# Patient Record
Sex: Female | Born: 1965 | Race: White | Hispanic: No | Marital: Married | Smoking: Never smoker
Health system: Southern US, Community
[De-identification: ages and names within clinical notes are randomized; demographics above are authoritative.]

## PROBLEM LIST (undated history)

## (undated) DIAGNOSIS — F419 Anxiety disorder, unspecified: Secondary | ICD-10-CM

## (undated) DIAGNOSIS — K219 Gastro-esophageal reflux disease without esophagitis: Secondary | ICD-10-CM

## (undated) DIAGNOSIS — M549 Dorsalgia, unspecified: Secondary | ICD-10-CM

## (undated) DIAGNOSIS — T7840XA Allergy, unspecified, initial encounter: Secondary | ICD-10-CM

## (undated) HISTORY — DX: Gastro-esophageal reflux disease without esophagitis: K21.9

## (undated) HISTORY — DX: Anxiety disorder, unspecified: F41.9

## (undated) HISTORY — DX: Allergy, unspecified, initial encounter: T78.40XA

## (undated) HISTORY — PX: ABDOMINAL HYSTERECTOMY: SHX81

---

## 2006-02-04 ENCOUNTER — Encounter: Admission: RE | Admit: 2006-02-04 | Discharge: 2006-04-03 | Payer: Self-pay | Admitting: Family Medicine

## 2010-10-11 ENCOUNTER — Emergency Department (HOSPITAL_BASED_OUTPATIENT_CLINIC_OR_DEPARTMENT_OTHER)
Admission: EM | Admit: 2010-10-11 | Discharge: 2010-10-11 | Payer: Self-pay | Source: Home / Self Care | Admitting: Emergency Medicine

## 2010-10-12 ENCOUNTER — Ambulatory Visit: Payer: Self-pay | Admitting: Family Medicine

## 2010-11-25 ENCOUNTER — Encounter: Payer: Self-pay | Admitting: Internal Medicine

## 2010-12-12 ENCOUNTER — Encounter: Payer: Self-pay | Admitting: Family Medicine

## 2010-12-12 ENCOUNTER — Other Ambulatory Visit: Payer: Self-pay | Admitting: Family Medicine

## 2010-12-12 ENCOUNTER — Ambulatory Visit (INDEPENDENT_AMBULATORY_CARE_PROVIDER_SITE_OTHER): Payer: BC Managed Care – PPO | Admitting: Family Medicine

## 2010-12-12 DIAGNOSIS — F411 Generalized anxiety disorder: Secondary | ICD-10-CM

## 2010-12-12 DIAGNOSIS — R002 Palpitations: Secondary | ICD-10-CM | POA: Insufficient documentation

## 2010-12-13 LAB — CBC WITH DIFFERENTIAL/PLATELET
Basophils Relative: 0.3 % (ref 0.0–3.0)
Eosinophils Absolute: 0.1 10*3/uL (ref 0.0–0.7)
Hemoglobin: 13.6 g/dL (ref 12.0–15.0)
Lymphocytes Relative: 31.7 % (ref 12.0–46.0)
MCHC: 34.5 g/dL (ref 30.0–36.0)
Monocytes Relative: 5.8 % (ref 3.0–12.0)
Neutro Abs: 4.4 10*3/uL (ref 1.4–7.7)
RBC: 4.14 Mil/uL (ref 3.87–5.11)

## 2010-12-29 ENCOUNTER — Encounter: Payer: Self-pay | Admitting: Family Medicine

## 2011-01-01 NOTE — Assessment & Plan Note (Signed)
Summary: new pt --having panic attacks???,BCBS///sph   Vital Signs:  Patient profile:   45 year old female Height:      64 inches Weight:      152 pounds BMI:     26.19 Pulse rate:   110 / minute BP sitting:   130 / 90  (left arm)  Vitals Entered By: Doristine Devoid CMA (December 12, 2010 3:02 PM) CC: NEW EST- panic attacks heart racing xyest while out driving had to use onstar to calm down   History of Present Illness: 45 yo woman here today to establish care.  previous MD- [facility].  anxiety- drove daughter to school yesterday and when driving home had 'panic attack'.  heart was racing, 'beating out of my chest', got very thirsty, 'everything was closing up', tongue felt numb.  used [employer] to calm down.  was fine the rest of the day.  this AM again had 'wave of panic' when dropping off daughter.  sxs improve when talking to someone- this AM called husband.  has always been anxious.  having L sided neck spasm and tightness.  tearful during OV.  doesn't cry frequently but 'i worry all the time about everything'.  doesn't like to take meds.  will have rapid heart beat during sleep.  has never been in counseling.  has been given a script for xanax previously but never took any.  Preventive Screening-Counseling & Management  Alcohol-Tobacco     Alcohol drinks/day: <1     Smoking Status: quit     Year Quit: 1999  Caffeine-Diet-Exercise     Does Patient Exercise: no      Sexual History:  currently monogamous.        Drug Use:  never.    Current Medications (verified): 1)  Alprazolam 0.25 Mg Tabs (Alprazolam) .Marland Kitchen.. 1 Tab By Mouth As Needed For Severe Anxiety 2)  Citalopram Hydrobromide 10 Mg  Tabs (Citalopram Hydrobromide) .... Take 1 Tab By Mouth Daily 3)  Nasonex 50 Mcg/act Susp (Mometasone Furoate) .... 2 Sprays Each Nostril Once Daily  Allergies (verified): 1)  ! * Zithromycin  Past History:  Past Medical History: none  Past Surgical History: none  Family  History: CAD-no HTN-father,brother DM-mother STROKE-no COLON CA-no BREAST CA-no  Social History: married 2 daughtersSmoking Status:  quit Does Patient Exercise:  no Sexual History:  currently monogamous Drug Use:  never  Review of Systems      See HPI  Physical Exam  General:  Well-developed,well-nourished,in no acute distress; alert,appropriate and cooperative throughout examination Head:  Normocephalic and atraumatic without obvious abnormalities. No apparent alopecia or balding. Eyes:  PERRL, EOMI Neck:  No deformities, masses, or tenderness noted. Lungs:  Normal respiratory effort, chest expands symmetrically. Lungs are clear to auscultation, no crackles or wheezes. Heart:  Normal rate and regular rhythm. S1 and S2 normal without gallop, murmur, click, rub or other extra sounds. Psych:  very anxious, pressured speech, flight of ideas, tangential thoughts   Impression & Recommendations:  Problem # 1:  PALPITATIONS (ICD-785.1) Assessment New most likely due to pt's anxiety but need to r/o thyroid abnormalities, anemia.  EKG WNL. Orders: Venipuncture (95284) Specimen Handling (13244) EKG w/ Interpretation (93000) TLB-TSH (Thyroid Stimulating Hormone) (84443-TSH) TLB-CBC Platelet - w/Differential (85025-CBCD)  Problem # 2:  ANXIETY STATE, UNSPECIFIED (ICD-300.00) Assessment: New pt w/ obvious anxiety.  previous MD agreed and attempted to start benzo to control sxs- pt never used.  again recommend benzos for panic attacks and will start controller medication.  will follow closely. Her updated medication list for this problem includes:    Alprazolam 0.25 Mg Tabs (Alprazolam) .Marland Kitchen... 1 tab by mouth as needed for severe anxiety    Citalopram Hydrobromide 10 Mg Tabs (Citalopram hydrobromide) .Marland Kitchen... Take 1 tab by mouth daily  Complete Medication List: 1)  Alprazolam 0.25 Mg Tabs (Alprazolam) .Marland Kitchen.. 1 tab by mouth as needed for severe anxiety 2)  Citalopram Hydrobromide 10 Mg  Tabs (Citalopram hydrobromide) .... Take 1 tab by mouth daily 3)  Nasonex 50 Mcg/act Susp (Mometasone furoate) .... 2 sprays each nostril once daily  Patient Instructions: 1)  Please schedule a complete physical in the next 4-6 weeks 2)  Start the Citalopram daily as a controller medicine for your anxiety 3)  Use the Alprazolam as needed for severe panic attacks 4)  Consider starting counseling for your anxiety- it has gone from just something that is a part of you to something that is affecting your daily life 5)  We'll call you to set up your holter monitor 6)  Start the nasal spray to decrease your nasal congestion, post nasal drip and sinus pressure 7)  Call with any questions or concerns 8)  Hang in there! Prescriptions: NASONEX 50 MCG/ACT SUSP (MOMETASONE FUROATE) 2 sprays each nostril once daily  #1 x 3   Entered and Authorized by:   Neena Rhymes MD   Signed by:   Neena Rhymes MD on 12/12/2010   Method used:   Print then Give to Patient   RxID:   [id number] CITALOPRAM HYDROBROMIDE 10 MG  TABS (CITALOPRAM HYDROBROMIDE) Take 1 tab by mouth daily  #30 x 3   Entered and Authorized by:   Neena Rhymes MD   Signed by:   Neena Rhymes MD on 12/12/2010   Method used:   Print then Give to Patient   RxID:   [id number] ALPRAZOLAM 0.25 MG TABS (ALPRAZOLAM) 1 tab by mouth as needed for severe anxiety  #30 x 0   Entered and Authorized by:   Neena Rhymes MD   Signed by:   Neena Rhymes MD on 12/12/2010   Method used:   Print then Give to Patient   RxID:   [id number]    Orders Added: 1)  Venipuncture [40347] 2)  Specimen Handling [99000] 3)  EKG w/ Interpretation [93000] 4)  TLB-TSH (Thyroid Stimulating Hormone) [84443-TSH] 5)  TLB-CBC Platelet - w/Differential [85025-CBCD] 6)  New Patient Level II [42595]

## 2011-01-15 LAB — CBC
HCT: 41.1 % (ref 36.0–46.0)
MCH: 31.3 pg (ref 26.0–34.0)
MCHC: 35.3 g/dL (ref 30.0–36.0)
MCV: 88.6 fL (ref 78.0–100.0)
RDW: 12.1 % (ref 11.5–15.5)

## 2011-01-15 LAB — BASIC METABOLIC PANEL WITH GFR
BUN: 8 mg/dL (ref 6–23)
CO2: 24 meq/L (ref 19–32)
Calcium: 9.6 mg/dL (ref 8.4–10.5)
Chloride: 107 meq/L (ref 96–112)
Creatinine, Ser: 0.8 mg/dL (ref 0.4–1.2)
GFR calc non Af Amer: >60 mL/min
Glucose, Bld: 119 mg/dL — ABNORMAL HIGH (ref 70–99)
Potassium: 4.1 meq/L (ref 3.5–5.1)
Sodium: 147 meq/L — ABNORMAL HIGH (ref 135–145)

## 2011-01-15 LAB — DIFFERENTIAL
Basophils Absolute: 0 10*3/uL (ref 0.0–0.1)
Basophils Relative: 1 % (ref 0–1)
Eosinophils Relative: 0 % (ref 0–5)
Monocytes Absolute: 0.4 10*3/uL (ref 0.1–1.0)

## 2011-01-21 ENCOUNTER — Encounter: Payer: Self-pay | Admitting: Family Medicine

## 2011-01-21 ENCOUNTER — Encounter (INDEPENDENT_AMBULATORY_CARE_PROVIDER_SITE_OTHER): Payer: BC Managed Care – PPO | Admitting: Family Medicine

## 2011-01-21 ENCOUNTER — Other Ambulatory Visit: Payer: Self-pay | Admitting: Family Medicine

## 2011-01-21 DIAGNOSIS — Z0000 Encounter for general adult medical examination without abnormal findings: Secondary | ICD-10-CM

## 2011-01-21 DIAGNOSIS — F411 Generalized anxiety disorder: Secondary | ICD-10-CM

## 2011-01-21 LAB — BASIC METABOLIC PANEL
CO2: 24 mEq/L (ref 19–32)
GFR: 71.88 mL/min (ref >60.00)
Glucose, Bld: 132 mg/dL — ABNORMAL HIGH (ref 70–99)
Potassium: 4.1 mEq/L (ref 3.5–5.1)
Sodium: 136 mEq/L (ref 135–145)

## 2011-01-21 LAB — LIPID PANEL: VLDL: 20 mg/dL (ref 0.0–40.0)

## 2011-01-21 LAB — HEPATIC FUNCTION PANEL
AST: 17 U/L (ref 0–37)
Albumin: 4.4 g/dL (ref 3.5–5.2)
Alkaline Phosphatase: 48 U/L (ref 39–117)

## 2011-01-22 ENCOUNTER — Telehealth: Payer: Self-pay | Admitting: *Deleted

## 2011-01-22 MED ORDER — VITAMIN D (ERGOCALCIFEROL) 1.25 MG (50000 UNIT) PO CAPS: ORAL_CAPSULE | ORAL | Status: DC

## 2011-01-22 NOTE — Telephone Encounter (Signed)
Pt notified and notes that she was not fasting at the time of labs, she had eaten a Nutri-Grain bar and diet soda. She is aware we will not add on labs at this time. Pt would like vitamin d rx sent to [facility] on [address].  Copied from [facility]: please make sure pt was actually fasting.  if she was- we need to add A1C b/c sugar was elevated.  LDL was also mildly elevated but this will improve w/ diet and exercise  rest of labs look good!   Signed by Neena Rhymes MD on 01/21/2011 at 4:28 PM Vit D level low- needs to start 50,000 units Vit D weekly x8 weeks and then recheck level   Signed by Neena Rhymes MD on 01/22/2011 at 8:16 AM

## 2011-01-31 NOTE — Assessment & Plan Note (Signed)
Summary: cpx/cbs   Vital Signs:  Patient profile:   45 year old female Height:      64 inches (162.56 cm) Weight:      147.50 pounds (67.05 kg) BMI:     25.41 Temp:     98.5 degrees F (36.94 degrees C) oral BP sitting:   112 / 78  (left arm) Cuff size:   regular  Vitals Entered By: Lucious Groves CMA (January 21, 2011 10:06 AM) CC: CPX no pap./kb Is Patient Diabetic? No Pain Assessment Patient in pain? no      Comments Patient notes that she is on her cycle so we cannot do pap, but she does need one. Patient wonders if she needs appt with GYN for cycle issues.   History of Present Illness: 45 yo woman here today for CPE.  anxiety- started counseling.  did not start meds.  reports the anxiety has somewhat improved.  not interested in starting medication.  denies recent palpitations.  not interested in holter monitor.  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  Preventive Screening-Counseling & Management  Alcohol-Tobacco     Alcohol drinks/day: <1     Smoking Status: quit     Year Quit: 1998  Caffeine-Diet-Exercise     Does Patient Exercise: no      Sexual History:  currently monogamous.        Drug Use:  never.    Current Medications (verified): 1)  Nasonex 50 Mcg/act Susp (Mometasone Furoate) .... 2 Sprays Each Nostril Once Daily  Allergies (verified): 1)  ! * Zithromycin  Past History:  Past medical, surgical, family and social histories (including risk factors) reviewed, and no changes noted (except as noted below).  Past Medical History: anxiety  Past Surgical History: Reviewed history from 12/12/2010 and no changes required. none  Family History: Reviewed history from 12/12/2010 and no changes required. CAD-no HTN-father,brother DM-mother STROKE-no COLON CA-no BREAST CA-no  Social History: Reviewed history from 12/12/2010 and no changes required. married 2 daughters  Review of Systems       The patient complains of depression.   The patient denies anorexia, fever, weight loss, weight gain, vision loss, decreased hearing, hoarseness, chest pain, syncope, dyspnea on exertion, peripheral edema, prolonged cough, headaches, abdominal pain, melena, hematochezia, severe indigestion/heartburn, hematuria, suspicious skin lesions, abnormal bleeding, enlarged lymph nodes, and breast masses.    Physical Exam  General:  Well-developed,well-nourished,in no acute distress; alert,appropriate and cooperative throughout examination Head:  Normocephalic and atraumatic without obvious abnormalities. No apparent alopecia or balding. Eyes:  No corneal or conjunctival inflammation noted. EOMI. Perrla. Funduscopic exam benign, without hemorrhages, exudates or papilledema. Vision grossly normal. Ears:  External ear exam shows no significant lesions or deformities.  Otoscopic examination reveals clear canals, tympanic membranes are intact bilaterally without bulging, retraction, inflammation or discharge. Hearing is grossly normal bilaterally. Nose:  External nasal examination shows no deformity or inflammation. Nasal mucosa are pink and moist without lesions or exudates. Mouth:  Oral mucosa and oropharynx without lesions or exudates.  Teeth in good repair. Neck:  No deformities, masses, or tenderness noted. Lungs:  Normal respiratory effort, chest expands symmetrically. Lungs are clear to auscultation, no crackles or wheezes. Heart:  Normal rate and regular rhythm. S1 and S2 normal without gallop, murmur, click, rub or other extra sounds. Abdomen:  Bowel sounds positive,abdomen soft and non-tender without masses, organomegaly or hernias noted. Pulses:  +2 carotid, radial, DP Extremities:  No clubbing, cyanosis, edema, or deformity  noted with normal full range of motion of all joints.   Neurologic:  No cranial nerve deficits noted. Station and gait are normal. Plantar reflexes are down-going bilaterally. DTRs are symmetrical throughout. Sensory,  motor and coordinative functions appear intact. Skin:  Intact without suspicious lesions or rashes Cervical Nodes:  No lymphadenopathy noted Psych:  less anxious today but still noteably anxious.  ideas more linear today- more focused   Impression & Recommendations:  Problem # 1:  PHYSICAL EXAMINATION (ICD-V70.0) Assessment New pt's PE WNL.  check labs.  refer to GYN at pt's request. Orders: Venipuncture (11914) TLB-Lipid Panel (80061-LIPID) TLB-BMP (Basic Metabolic Panel-BMET) (80048-METABOL) TLB-Hepatic/Liver Function Pnl (80076-HEPATIC) T-Vitamin D (25-Hydroxy) (78295-62130) Gynecologic Referral (Gyn) Specimen Handling (86578)  Problem # 2:  ANXIETY STATE, UNSPECIFIED (ICD-300.00) Assessment: Unchanged pt is not taking meds as directed.  did start counseling.  applauded this decision.  will follow. The following medications were removed from the medication list:    Alprazolam 0.25 Mg Tabs (Alprazolam) .Marland Kitchen... 1 tab by mouth as needed for severe anxiety    Citalopram Hydrobromide 10 Mg Tabs (Citalopram hydrobromide) .Marland Kitchen... Take 1 tab by mouth daily  Complete Medication List: 1)  Nasonex 50 Mcg/act Susp (Mometasone furoate) .... 2 sprays each nostril once daily  Patient Instructions: 1)  Follow up in 3 months to recheck your anxiety 2)  We'll notify you of your lab results 3)  Someone will call you with your GYN appt 4)  Call with any questions or concerns 5)  Hang in there!!!   Orders Added: 1)  Venipuncture [36415] 2)  TLB-Lipid Panel [80061-LIPID] 3)  TLB-BMP (Basic Metabolic Panel-BMET) [80048-METABOL] 4)  TLB-Hepatic/Liver Function Pnl [80076-HEPATIC] 5)  T-Vitamin D (25-Hydroxy) [46962-95284] 6)  Gynecologic Referral [Gyn] 7)  Specimen Handling [99000] 8)  Est. Patient 40-64 years [99396] 9)  Est. Patient Level III [13244]

## 2011-04-24 ENCOUNTER — Ambulatory Visit: Payer: BC Managed Care – PPO | Admitting: Family Medicine

## 2011-05-16 ENCOUNTER — Emergency Department (HOSPITAL_BASED_OUTPATIENT_CLINIC_OR_DEPARTMENT_OTHER)
Admission: EM | Admit: 2011-05-16 | Discharge: 2011-05-16 | Disposition: A | Discharge status: Home / Self Care | Payer: BC Managed Care – PPO | Attending: Emergency Medicine | Admitting: Emergency Medicine

## 2011-05-16 ENCOUNTER — Encounter (HOSPITAL_BASED_OUTPATIENT_CLINIC_OR_DEPARTMENT_OTHER): Payer: Self-pay | Admitting: *Deleted

## 2011-05-16 DIAGNOSIS — X500XXA Overexertion from strenuous movement or load, initial encounter: Secondary | ICD-10-CM | POA: Insufficient documentation

## 2011-05-16 DIAGNOSIS — S139XXA Sprain of joints and ligaments of unspecified parts of neck, initial encounter: Secondary | ICD-10-CM | POA: Insufficient documentation

## 2011-05-16 DIAGNOSIS — S161XXA Strain of muscle, fascia and tendon at neck level, initial encounter: Secondary | ICD-10-CM

## 2011-05-16 DIAGNOSIS — Y9289 Other specified places as the place of occurrence of the external cause: Secondary | ICD-10-CM | POA: Insufficient documentation

## 2011-05-16 HISTORY — DX: Dorsalgia, unspecified: M54.9

## 2011-05-16 MED ORDER — DIAZEPAM 5 MG PO TABS: 2.5000 mg | ORAL_TABLET | Freq: Once | ORAL | Status: DC

## 2011-05-16 MED ORDER — KETOROLAC TROMETHAMINE 30 MG/ML IJ SOLN
30.0000 mg | Freq: Once | INTRAMUSCULAR | Status: DC
  Filled 2011-05-16: qty 1

## 2011-05-16 MED ORDER — ONDANSETRON 8 MG PO TBDP
8.0000 mg | ORAL_TABLET | Freq: Once | ORAL | Status: DC
  Filled 2011-05-16: qty 1

## 2011-05-16 MED ORDER — DIAZEPAM 2 MG PO TABS
2.0000 mg | ORAL_TABLET | Freq: Once | ORAL | Status: DC
  Filled 2011-05-16: qty 1

## 2011-05-16 MED ORDER — DIAZEPAM 5 MG PO TABS: 5.0000 mg | ORAL_TABLET | Freq: Four times a day (QID) | ORAL | Status: AC | PRN

## 2011-05-16 MED ORDER — DIAZEPAM 5 MG PO TABS
ORAL_TABLET | ORAL | Status: AC
  Filled 2011-05-16: qty 1

## 2011-05-16 NOTE — ED Provider Notes (Addendum)
History     Chief Complaint  Patient presents with  . Neck Pain   HPI Pt w/ h/o chronic neck pain presents w/ acutely severe pain in left side of neck x 4 days.  Pain started while sitting at a show w/ head rotated to the right x 3 hours.   Aggravated by minimal head rotation and ROM of either upper extremity.  Denies dyspnea/dysphagia.  Denies cough, CP, SOB.  Husband states that pain has brought pt to tears.  Past Medical History  Diagnosis Date  . Anxiety   . Anxiety   . Back pain     History reviewed. No pertinent past surgical history.  Family History  Problem Relation Age of Onset  . Diabetes Mother   . Hypertension Father   . Hypertension Brother     History  Substance Use Topics  . Smoking status: Never Smoker   . Smokeless tobacco: Not on file  . Alcohol Use: No    OB History    Grav Para Term Preterm Abortions TAB SAB Ect Mult Living                  Review of Systems  All other systems reviewed and are negative.    Physical Exam  BP 131/72  Pulse 82  Temp(Src) 98.1 F (36.7 C) (Oral)  Resp 20  Ht 5\' 4"  (1.626 m)  Wt 140 lb (63.504 kg)  BMI 24.03 kg/m2  SpO2 100%  LMP 04/29/2011  Physical Exam  Constitutional: She appears well-developed and well-nourished. She appears distressed.  HENT:  Head: Normocephalic and atraumatic.  Neck: Neck supple. No spinous process tenderness present. Carotid bruit is not present. Decreased range of motion present. No edema present.       Pt points to pain at left SCM and trapezius.  No overlying skin changes. Non-tender.    Cardiovascular: Normal rate and regular rhythm.   Pulmonary/Chest: Effort normal and breath sounds normal. No stridor. No respiratory distress.       Non-tender  Lymphadenopathy:    She has no cervical adenopathy.    ED Course  Procedures  MDM Pt presents w/ acute on chronic left neck pain.  Pain reproducible w/ head rotation and ROM of LUE on exam.  S/Sx most consistent w/ cervical  strain but can not r/o radiculopathy.  Offered pt 2.5mg  valium and IM toradol in ED for pain control but she declined.  Reports being very sensitive to medications.  Recommended conservative therapy including NSAID, rest, heating pad or ice and then f/u with NS if sx do not improve.  She received a soft cervical collar for comfort.  Advised her to return if she develops fever or symptoms worsen.        Otilio Miu, PA 05/17/11 0033  Otilio Miu, PA 05/17/11 0033  Otilio Miu, PA 05/17/11 [phone]  Evaluation and management procedures were performed by the PA/NP under my supervision/collaboration.    Felisa Bonier, MD 08/31/11 [phone]

## 2011-05-16 NOTE — ED Notes (Signed)
Went in to administer patient medication, pt states that she does not want the medication that she just wants prescriptions and be able to go home. Discussed with PA

## 2011-07-17 IMAGING — CT CT HEAD W/O CM
1 series · 16 of 30 positions shown, 20 images · non-contrast
Comparison: None

CLINICAL DATA: Dizziness.  Left facial pressure

CT HEAD WITHOUT CONTRAST
TECHNIQUE: Contiguous axial images were obtained from the base of
the skull through the vertex without contrast.

[Series 2: head 4.8 h37s · axial · 0.42mm/px · z∈[-208,-56]mm · 16 of 36 slices shown, 20 images]
[im 2/36  brain]
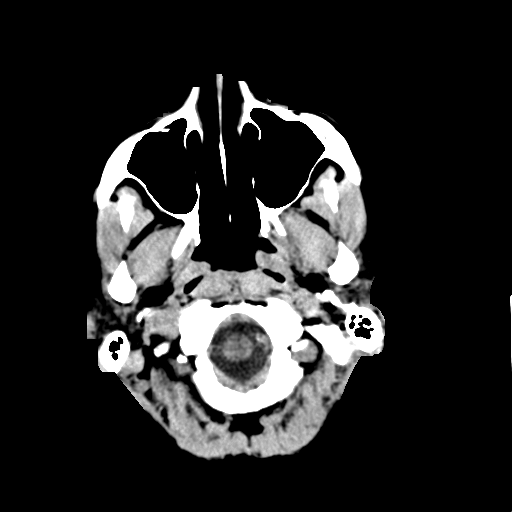
[im 2/36  bone]
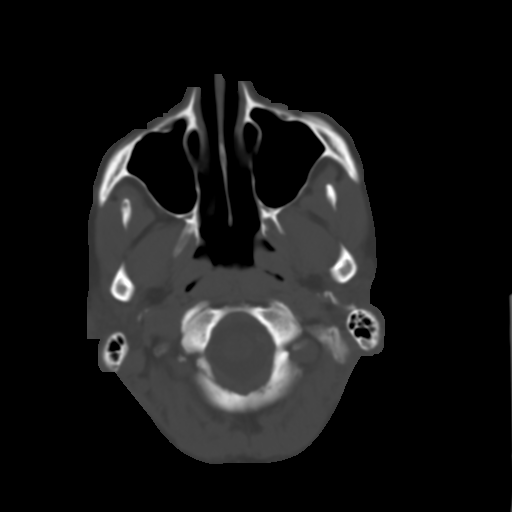
[im 4/36  brain]
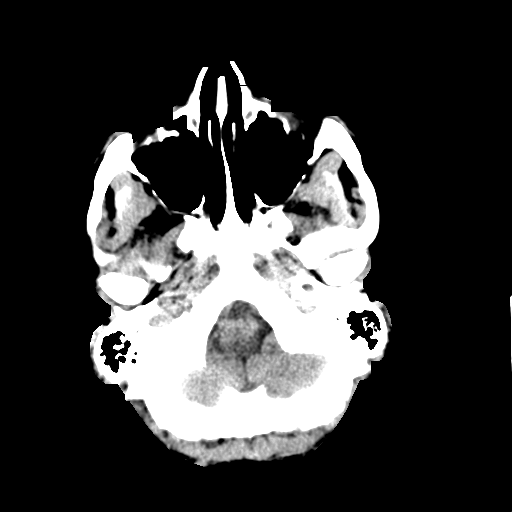
[im 7/36  brain]
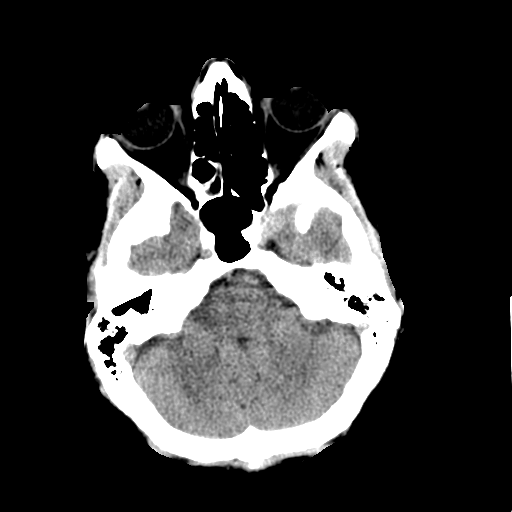
[im 9/36  brain]
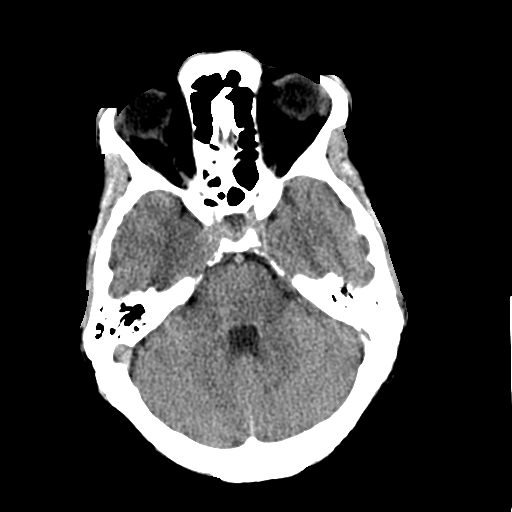
[im 10/36  brain]
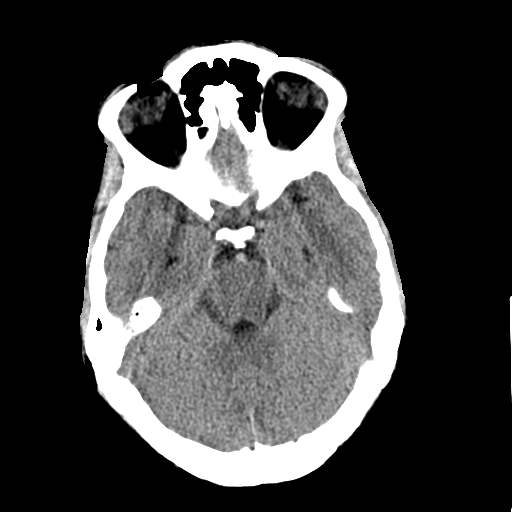
[im 10/36  bone]
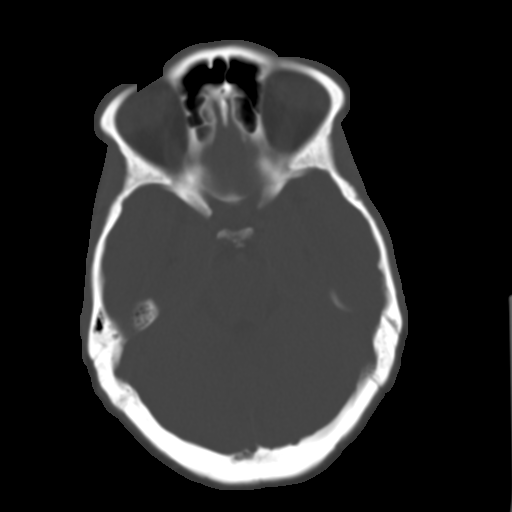
[im 13/36  brain]
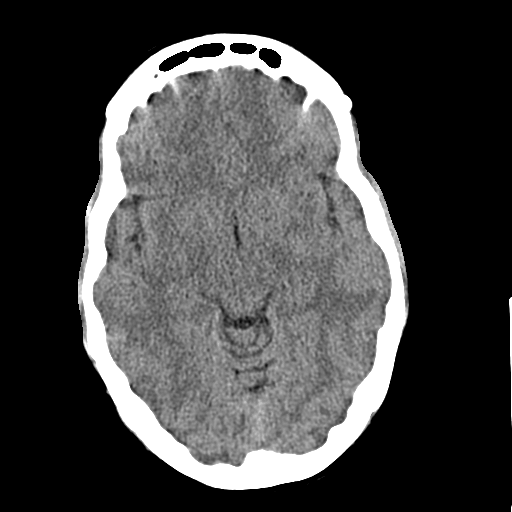
[im 15/36  brain]
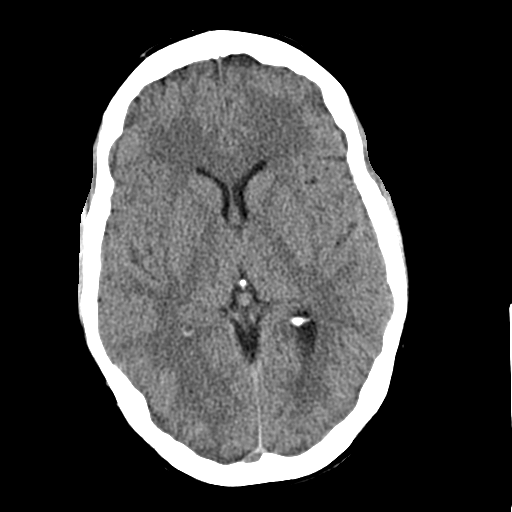
[im 17/36  brain]
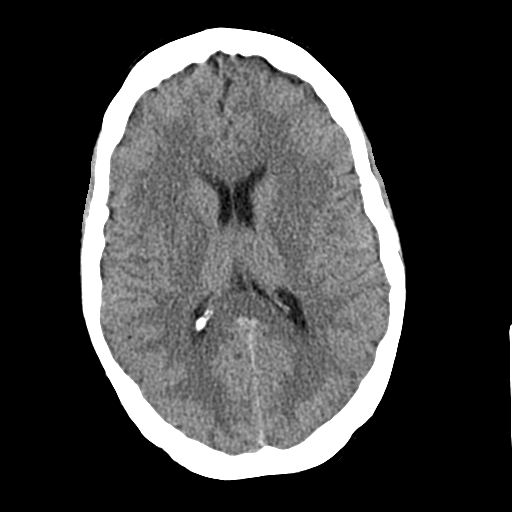
[im 19/36  brain]
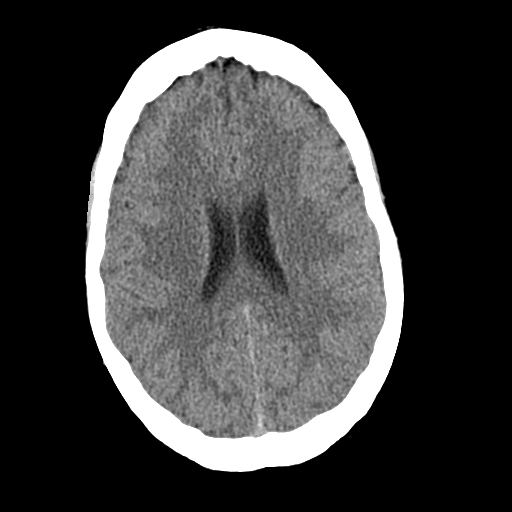
[im 19/36  bone]
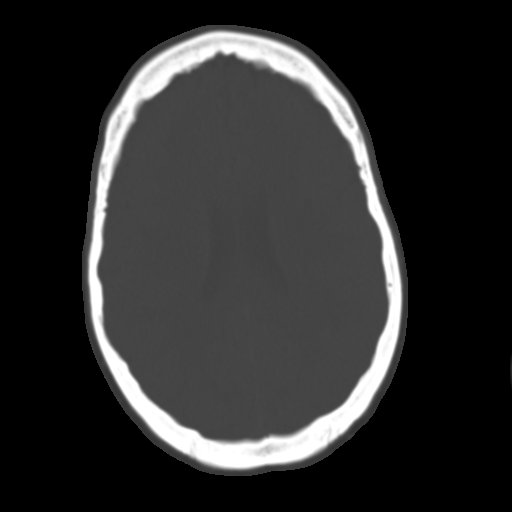
[im 21/36  brain]
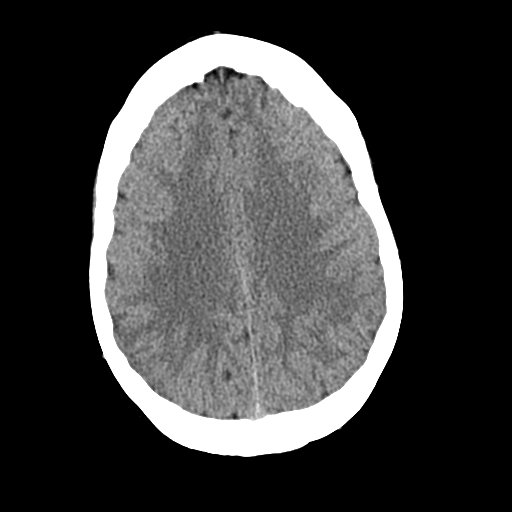
[im 23/36  brain]
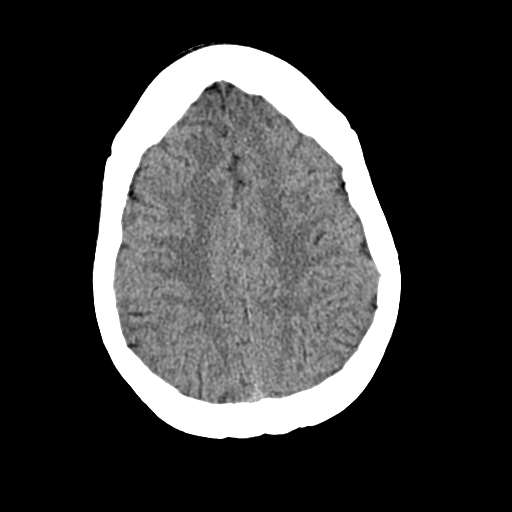
[im 26/36  brain]
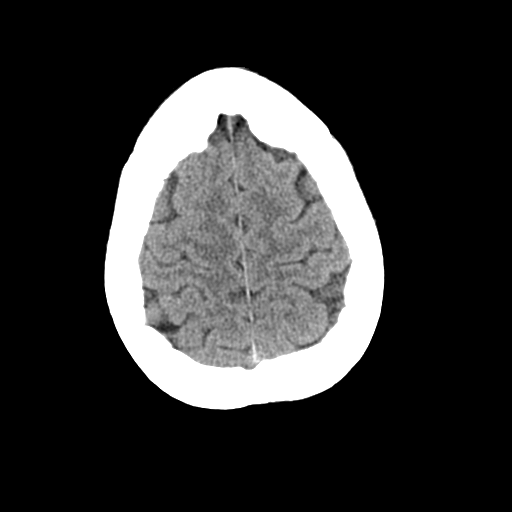
[im 27/36  brain]
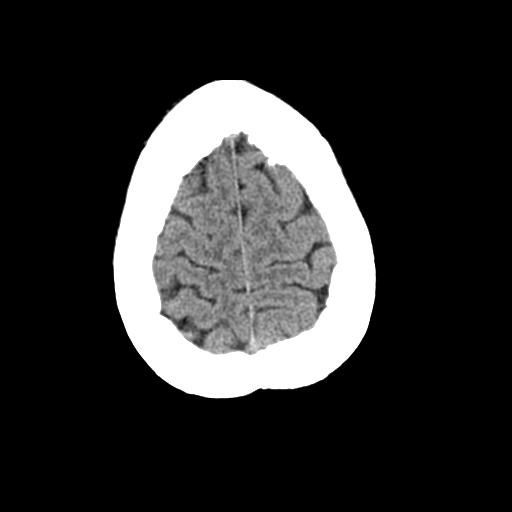
[im 27/36  bone]
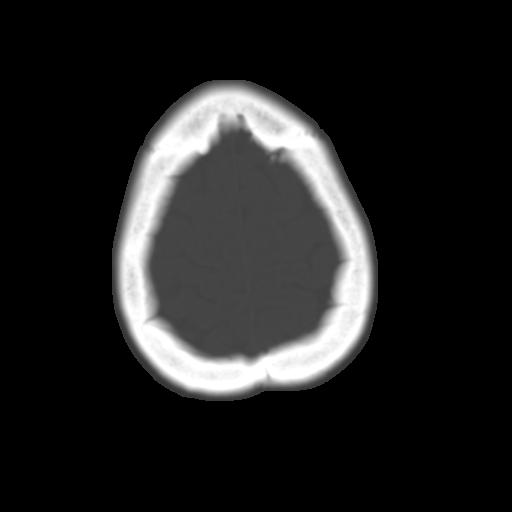
[im 29/36  brain]
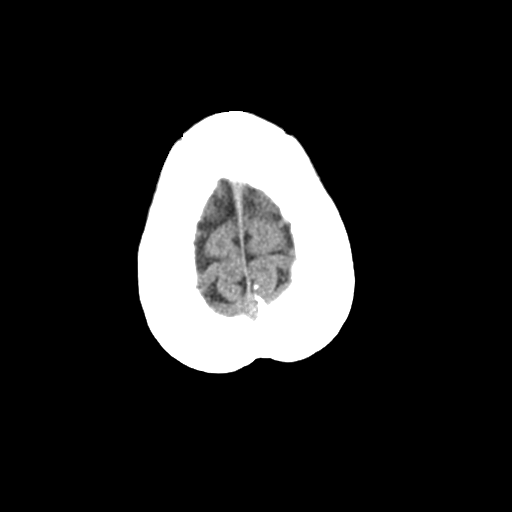
[im 32/36  brain]
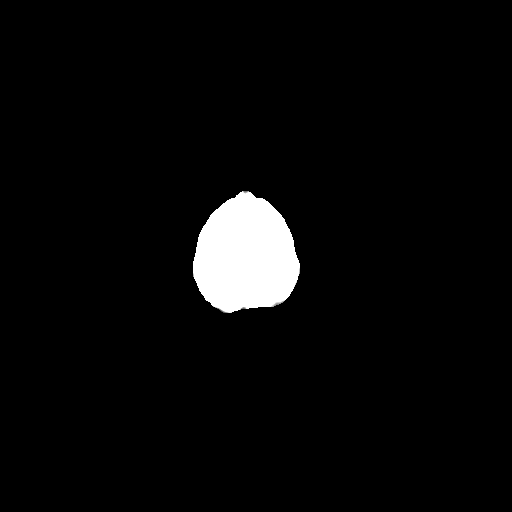
[im 34/36  brain]
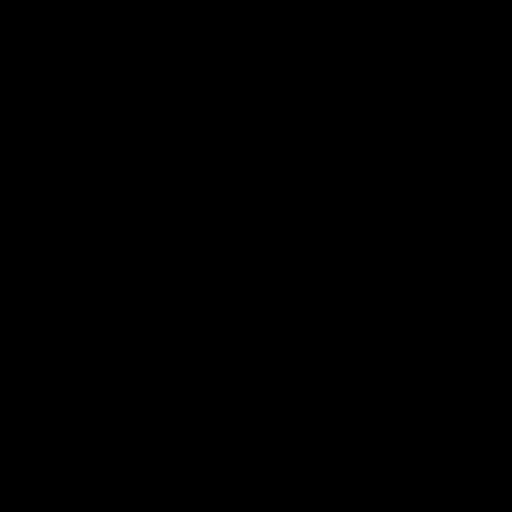

[16 of 30 positions shown; findings below may reference images not displayed]

FINDINGS: Ventricle size is normal.  Negative for infarct or mass.
Negative for hemorrhage.

Small osteoma lateral to the left orbit.  No acute bony
abnormality.  Mild chronic sinusitis.
IMPRESSION: Normal brain

Mild chronic sinusitis.

## 2011-08-02 ENCOUNTER — Other Ambulatory Visit: Payer: Self-pay | Admitting: Obstetrics and Gynecology

## 2011-08-02 DIAGNOSIS — R928 Other abnormal and inconclusive findings on diagnostic imaging of breast: Secondary | ICD-10-CM

## 2011-08-14 ENCOUNTER — Ambulatory Visit
Admission: RE | Admit: 2011-08-14 | Discharge: 2011-08-14 | Disposition: A | Discharge status: Home / Self Care | Payer: BC Managed Care – PPO | Source: Ambulatory Visit | Attending: Obstetrics and Gynecology | Admitting: Obstetrics and Gynecology

## 2011-08-14 DIAGNOSIS — R928 Other abnormal and inconclusive findings on diagnostic imaging of breast: Secondary | ICD-10-CM

## 2012-03-26 ENCOUNTER — Telehealth: Payer: Self-pay | Admitting: Family Medicine

## 2012-03-26 NOTE — Telephone Encounter (Signed)
Pt would like to know if Dr. Beverely Low can treat her for her Rosacea. She states she used to see Dr. Oliver Hum and he gave her a cream for it that helped, but it is expired and would like a new rx for it. She does not want to come to see Dr. Beverely Low if she is going to be referred to a dermatologist due to her high copays. Please advise.

## 2012-03-26 NOTE — Telephone Encounter (Signed)
Called pt to advise MD Beverely Low is willing to treat her rosacea, however notes pt is overdue for CPE, per verbal from MD Tabori advised to set pt up for an acute visit and we can get her CPE scheduled after the acute visit,pt understood and accepted apt on 04-01-12 at 11:15am,

## 2012-04-01 ENCOUNTER — Ambulatory Visit (INDEPENDENT_AMBULATORY_CARE_PROVIDER_SITE_OTHER): Payer: BC Managed Care – PPO | Admitting: Family Medicine

## 2012-04-01 ENCOUNTER — Encounter: Payer: Self-pay | Admitting: Family Medicine

## 2012-04-01 VITALS — BP 124/79 | HR 78 | Temp 98.6°F | Ht 64.5 in | Wt 159.0 lb

## 2012-04-01 DIAGNOSIS — L719 Rosacea, unspecified: Secondary | ICD-10-CM | POA: Insufficient documentation

## 2012-04-01 DIAGNOSIS — F411 Generalized anxiety disorder: Secondary | ICD-10-CM

## 2012-04-01 MED ORDER — METRONIDAZOLE 0.75 % EX LOTN: 1.0000 "application " | TOPICAL_LOTION | Freq: Two times a day (BID) | CUTANEOUS | Status: DC

## 2012-04-01 MED ORDER — SERTRALINE HCL 25 MG PO TABS: 25.0000 mg | ORAL_TABLET | Freq: Every day | ORAL | Status: DC

## 2012-04-01 NOTE — Assessment & Plan Note (Signed)
Deteriorated.  Pt now fearful of leaving house.  Stressed importance of SSRI to better control sxs despite pt's fear of starting med.  Discussed counseling in addition to meds.  Will follow closely.

## 2012-04-01 NOTE — Progress Notes (Signed)
  Subjective:    Patient ID: Karen Cisneros, female    DOB: [birth year], 46 y.o.   MRN: [id number]  HPI Rosacea- went to derm in 2008 and was treated w/ Metronidazole lotion 0.75%.  Had been sx free for quite a while but recently again developed redness and acne like bumps on her cheeks.  Some increased stress recently.   Anxiety- chronic problem, never filled meds.  Never took xanax.  Very nervous anytime she needs to leave the house.  Was previously in counseling but stopped.  'it's just too much.  i've been dealing w/ it for so long'.  Review of Systems For ROS see HPI     Objective:   Physical Exam  Vitals reviewed. Constitutional: She appears well-developed and well-nourished. She appears distressed (tearful, obviously upset).  HENT:  Head: Normocephalic and atraumatic.  Skin: Skin is warm and dry. There is erythema (facial erythema w/ acne like lesions on cheeks- consistent w/ rosacea).  Psychiatric:       Tearful, anxious, obviously distraught          Assessment & Plan:

## 2012-04-01 NOTE — Assessment & Plan Note (Signed)
New.  Start topical metronidazole.

## 2012-04-01 NOTE — Patient Instructions (Signed)
Follow up in 1 month to see how the meds are working Use the Metronidazole lotion as needed on the face Call with any questions or concerns Hang in there!!!

## 2012-05-01 ENCOUNTER — Ambulatory Visit: Payer: BC Managed Care – PPO | Admitting: Family Medicine

## 2012-05-25 ENCOUNTER — Encounter: Payer: BC Managed Care – PPO | Admitting: Family Medicine

## 2012-07-21 ENCOUNTER — Encounter: Payer: BC Managed Care – PPO | Admitting: Family Medicine

## 2015-12-22 ENCOUNTER — Emergency Department (HOSPITAL_BASED_OUTPATIENT_CLINIC_OR_DEPARTMENT_OTHER)
Admission: EM | Admit: 2015-12-22 | Discharge: 2015-12-22 | Disposition: A | Discharge status: Home / Self Care | Payer: 59 | Attending: Emergency Medicine | Admitting: Emergency Medicine

## 2015-12-22 ENCOUNTER — Encounter (HOSPITAL_BASED_OUTPATIENT_CLINIC_OR_DEPARTMENT_OTHER): Payer: Self-pay | Admitting: *Deleted

## 2015-12-22 DIAGNOSIS — R1030 Lower abdominal pain, unspecified: Secondary | ICD-10-CM | POA: Insufficient documentation

## 2015-12-22 DIAGNOSIS — R39198 Other difficulties with micturition: Secondary | ICD-10-CM | POA: Insufficient documentation

## 2015-12-22 DIAGNOSIS — Z3202 Encounter for pregnancy test, result negative: Secondary | ICD-10-CM | POA: Diagnosis not present

## 2015-12-22 DIAGNOSIS — R3915 Urgency of urination: Secondary | ICD-10-CM | POA: Insufficient documentation

## 2015-12-22 DIAGNOSIS — Z8659 Personal history of other mental and behavioral disorders: Secondary | ICD-10-CM | POA: Insufficient documentation

## 2015-12-22 DIAGNOSIS — R140 Abdominal distension (gaseous): Secondary | ICD-10-CM | POA: Diagnosis not present

## 2015-12-22 DIAGNOSIS — R339 Retention of urine, unspecified: Secondary | ICD-10-CM | POA: Diagnosis not present

## 2015-12-22 LAB — URINALYSIS, ROUTINE W REFLEX MICROSCOPIC
Bilirubin Urine: NEGATIVE
GLUCOSE, UA: NEGATIVE mg/dL
HGB URINE DIPSTICK: NEGATIVE
KETONES UR: NEGATIVE mg/dL
LEUKOCYTES UA: NEGATIVE
Nitrite: NEGATIVE
PH: 5.5 (ref 5.0–8.0)
Protein, ur: NEGATIVE mg/dL
Specific Gravity, Urine: 1.012 (ref 1.005–1.030)

## 2015-12-22 LAB — BASIC METABOLIC PANEL
Anion gap: 9 (ref 5–15)
BUN: 13 mg/dL (ref 6–20)
CHLORIDE: 104 mmol/L (ref 101–111)
CO2: 24 mmol/L (ref 22–32)
CREATININE: 0.84 mg/dL (ref 0.44–1.00)
Calcium: 9.3 mg/dL (ref 8.9–10.3)
GFR calc non Af Amer: >60 mL/min (ref >60)
GLUCOSE: 136 mg/dL — AB (ref 65–99)
Potassium: 4.1 mmol/L (ref 3.5–5.1)
Sodium: 137 mmol/L (ref 135–145)

## 2015-12-22 LAB — CBC WITH DIFFERENTIAL/PLATELET
BASOS ABS: 0.1 10*3/uL (ref 0.0–0.1)
Basophils Relative: 1 %
EOS PCT: 1 %
Eosinophils Absolute: 0.1 10*3/uL (ref 0.0–0.7)
HEMATOCRIT: 38.8 % (ref 36.0–46.0)
Hemoglobin: 12.5 g/dL (ref 12.0–15.0)
LYMPHS ABS: 1.8 10*3/uL (ref 0.7–4.0)
LYMPHS PCT: 18 %
MCH: 28.2 pg (ref 26.0–34.0)
MCHC: 32.2 g/dL (ref 30.0–36.0)
MCV: 87.4 fL (ref 78.0–100.0)
Monocytes Absolute: 0.4 10*3/uL (ref 0.1–1.0)
Monocytes Relative: 4 %
NEUTROS ABS: 7.7 10*3/uL (ref 1.7–7.7)
Neutrophils Relative %: 76 %
Platelets: 266 10*3/uL (ref 150–400)
RBC: 4.44 MIL/uL (ref 3.87–5.11)
RDW: 13.4 % (ref 11.5–15.5)
WBC: 10 10*3/uL (ref 4.0–10.5)

## 2015-12-22 LAB — PREGNANCY, URINE: Preg Test, Ur: NEGATIVE

## 2015-12-22 NOTE — ED Provider Notes (Signed)
CSN: [id number]     Arrival date & time 12/22/15  4098 History   First MD Initiated Contact with Patient 12/22/15 [phone]     Chief Complaint  Patient presents with  . Urinary Retention    Karen Cisneros is a 50 y.o. female who presents to the ED complaining of not being able to urinate since waking up this morning. She last urinated last night. She reports suprapubic pressure. She reports she's been having some urinary urgency over the past 5 days. She denies any abdominal pain or flank pain. She denies any dysuria. No hematuria. Patient reports an urge to urinate but has been unable to urinate today. Patient is currently on her menstrual cycle. She denies fevers, vaginal bleeding, vaginal discharge, hematuria, dysuria, rashes, abdominal pain, nausea, vomiting or diarrhea.  The history is provided by the patient. No language interpreter was used.    Past Medical History  Diagnosis Date  . Anxiety   . Anxiety   . Back pain    History reviewed. No pertinent past surgical history. Family History  Problem Relation Age of Onset  . Diabetes Mother   . Hypertension Father   . Hypertension Brother    Social History  Substance Use Topics  . Smoking status: Never Smoker   . Smokeless tobacco: None  . Alcohol Use: No   OB History    No data available     Review of Systems  Constitutional: Negative for fever and chills.  HENT: Negative for sore throat.   Eyes: Negative for visual disturbance.  Respiratory: Negative for cough and shortness of breath.   Cardiovascular: Negative for chest pain.  Gastrointestinal: Negative for nausea, vomiting, abdominal pain, diarrhea and blood in stool.  Genitourinary: Positive for urgency and difficulty urinating. Negative for dysuria, frequency, hematuria, flank pain, vaginal bleeding, vaginal discharge and menstrual problem.  Musculoskeletal: Negative for back pain and neck pain.  Skin: Negative for rash.  Neurological: Negative for syncope and  headaches.      Allergies  Azithromycin  Home Medications   Prior to Admission medications   Not on File   BP 124/76 mmHg  Pulse 90  Temp(Src) 98.1 F (36.7 C) (Oral)  Resp 18  Ht  (1.626 m)  Wt 68.04 kg  BMI 25.73 kg/m2  SpO2 98%  LMP 12/22/2015 Physical Exam  Constitutional: She appears well-developed and well-nourished. No distress.  HENT:  Head: Normocephalic and atraumatic.  Mouth/Throat: Oropharynx is clear and moist.  Eyes: Conjunctivae are normal. Pupils are equal, round, and reactive to light. Right eye exhibits no discharge. Left eye exhibits no discharge.  Neck: Neck supple.  Cardiovascular: Normal rate, regular rhythm, normal heart sounds and intact distal pulses.  Exam reveals no gallop and no friction rub.   No murmur heard. Pulmonary/Chest: Effort normal and breath sounds normal. No respiratory distress. She has no wheezes. She has no rales.  Abdominal: Soft. Bowel sounds are normal. She exhibits distension. There is tenderness. There is no rebound and no guarding.  Abdomen soft. Bowel sounds are present. Patient has suprapubic abdominal tenderness to palpation of suprapubic distention. Bladder is palpable. No CVA or flank tenderness.  Musculoskeletal: She exhibits no edema.  Lymphadenopathy:    She has no cervical adenopathy.  Neurological: She is alert. Coordination normal.  Skin: Skin is warm and dry. No rash noted. She is not diaphoretic. No erythema. No pallor.  Psychiatric: She has a normal mood and affect. Her behavior is normal.  Nursing note and vitals  reviewed.   ED Course  Procedures (including critical care time) Labs Review Labs Reviewed  BASIC METABOLIC PANEL - Abnormal; Notable for the following:    Glucose, Bld 136 (*)    All other components within normal limits  URINALYSIS, ROUTINE W REFLEX MICROSCOPIC (NOT AT [facility])  PREGNANCY, URINE  CBC WITH DIFFERENTIAL/PLATELET    Imaging Review No results found. I have personally  reviewed and evaluated these lab results as part of my medical decision-making.   EKG Interpretation None      Filed Vitals:   12/22/15 0931 12/22/15 1147  BP: 154/86 124/76  Pulse: 106 90  Temp: 98.1 F (36.7 C)   TempSrc: Oral   Resp: 18 18  Height:  (1.626 m)   Weight: 68.04 kg   SpO2: 100% 98%     MDM   Final diagnoses:  Urinary retention   This  is a 50 y.o. female who presents to the ED complaining of not being able to urinate since waking up this morning. She last urinated last night. She reports suprapubic pressure. She reports she's been having some urinary urgency over the past 5 days. She denies any abdominal pain or flank pain. She denies any dysuria. No hematuria. Patient reports an urge to urinate but has been unable to urinate today. She reports that over the past several months she has had similar symptoms that were related to being on her menstrual cycle. On exam the patient is afebrile nontoxic appearing. Her abdomen is soft and she has tenderness suprapubically. Her bladder is palpable. Bladder scan reveals 688 mL's of urine in her bladder. Will insert Foley cath, check urine and creatinine. Urinalysis is unremarkable. Urine pregnancy is negative. BMP and CBC are unremarkable. Creatinine is 0.84. Preserved kidney function. No signs of infection on urinalysis. After catheterization patient reports feeling much better. She has no abdominal tenderness to palpation. No palpable bladder. Will discharge with Foley catheter in place with a leg bag. Will have her follow-up with urologist Dr. Isabel Caprice. I discussed strict and specific return precautions. I advised the patient to follow-up with their primary care provider this week. I advised the patient to return to the emergency department with new or worsening symptoms or new concerns. The patient verbalized understanding and agreement with plan.    This patient was discussed with Dr. Criss Alvine who agrees with assessment and  plan.    Everlene Farrier, PA-C 12/22/15 1246  Pricilla Loveless, MD 12/25/15 [phone]

## 2015-12-22 NOTE — ED Notes (Signed)
Bladder scan: 

## 2015-12-22 NOTE — ED Notes (Signed)
Urinary retention since this morning.  Reports urge to urinate.  Denies dysuria over the last few days

## 2015-12-22 NOTE — Discharge Instructions (Signed)
Acute Urinary Retention, Female °Acute urinary retention is the temporary inability to urinate. This is an uncommon problem in women. It can be caused by: °· Infection. °· A side effect of a medicine. °· A problem in a nearby organ that presses or squeezes on the bladder or the urethra (the tube that drains the bladder). °· Psychological problems. °·  Surgery on your bladder, urethra, or pelvic organs that causes obstruction to the outflow of urine from your bladder. °HOME CARE INSTRUCTIONS  °If you are sent home with a Foley catheter and a drainage system, you will need to discuss the best course of action with your health care provider. While the catheter is in, maintain a good intake of fluids. Keep the drainage bag emptied and lower than your catheter. This is so that contaminated urine will not flow back into your bladder, which could lead to a urinary tract infection. °There are two main types of drainage bags. One is a large bag that usually is used at night. It has a good capacity that will allow you to sleep through the night without having to empty it. The second type is called a leg bag. It has a smaller capacity so it needs to be emptied more frequently. However, the main advantage is that it can be attached by a leg strap and goes underneath your clothing, allowing you the freedom to move about or leave your home. °Only take over-the-counter or prescription medicines for pain, discomfort, or fever as directed by your health care provider.  °SEEK MEDICAL CARE IF: °1. You develop a low-grade fever. °2. You experience spasms or leakage of urine with the spasms. °SEEK IMMEDIATE MEDICAL CARE IF:  °1. You develop chills or fever. °2. Your catheter stops draining urine. °3. Your catheter falls out. °4. You start to develop increased bleeding that does not respond to rest and increased fluid intake. °MAKE SURE YOU: °1. Understand these instructions. °2. Will watch your condition. °3. Will get help right away if  you are not doing well or get worse. °  °This information is not intended to replace advice given to you by your health care provider. Make sure you discuss any questions you have with your health care provider. °  °Document Released: 10/20/2006 Document Revised: 03/07/2015 Document Reviewed: 04/01/2013 °Elsevier Interactive Patient Education ©2016 [employer] ° °Foley Catheter Care, Adult °A Foley catheter is a soft, flexible tube that is placed into the bladder to drain urine. A Foley catheter may be inserted if: °· You leak urine or are not able to control when you urinate (urinary incontinence). °· You are not able to urinate when you need to (urinary retention). °· You had prostate surgery or surgery on the genitals. °· You have certain medical conditions, such as multiple sclerosis, dementia, or a spinal cord injury. °If you are going home with a Foley catheter in place, follow the instructions below. °TAKING CARE OF THE CATHETER °3. Wash your hands with soap and water. °4. Using mild soap and warm water on a clean washcloth: °¨ Clean the area on your body closest to the catheter insertion site using a circular motion, moving away from the catheter. Never wipe toward the catheter because this could sweep bacteria up into the urethra and cause infection. °¨ Remove all traces of soap. Pat the area dry with a clean towel. For males, reposition the foreskin. °5. Attach the catheter to your leg so there is no tension on the catheter. Use adhesive tape or   a leg strap. If you are using adhesive tape, remove any sticky residue left behind by the previous tape you used. °6. Keep the drainage bag below the level of the bladder, but keep it off the floor. °7. Check throughout the day to be sure the catheter is working and urine is draining freely. Make sure the tubing does not become kinked. °8. Do not pull on the catheter or try to remove it. Pulling could damage internal tissues. °TAKING CARE OF THE DRAINAGE  BAGS °You will be given two drainage bags to take home. One is a large overnight drainage bag, and the other is a smaller leg bag that fits underneath clothing. You may wear the overnight bag at any time, but you should never wear the smaller leg bag at night. Follow the instructions below for how to empty, change, and clean your drainage bags. °Emptying the Drainage Bag °You must empty your drainage bag when it is  -½ full or at least 2-3 times a day. °5. Wash your hands with soap and water. °6. Keep the drainage bag below your hips, below the level of your bladder. This stops urine from going back into the tubing and into your bladder. °7. Hold the dirty bag over the toilet or a clean container. °8. Open the pour spout at the bottom of the bag and empty the urine into the toilet or container. Do not let the pour spout touch the toilet, container, or any other surface. Doing so can place bacteria on the bag, which can cause an infection. °9. Clean the pour spout with a gauze pad or cotton ball that has rubbing alcohol on it. °10. Close the pour spout. °11. Attach the bag to your leg with adhesive tape or a leg strap. °12. Wash your hands well. °Changing the Drainage Bag °Change your drainage bag once a month or sooner if it starts to smell bad or look dirty. Below are steps to follow when changing the drainage bag. °4. Wash your hands with soap and water. °5. Pinch off the rubber catheter so that urine does not spill out. °6. Disconnect the catheter tube from the drainage tube at the connection valve. Do not let the tubes touch any surface. °7. Clean the end of the catheter tube with an alcohol wipe. Use a different alcohol wipe to clean the end of the drainage tube. °8. Connect the catheter tube to the drainage tube of the clean drainage bag. °9. Attach the new bag to the leg with adhesive tape or a leg strap. Avoid attaching the new bag too tightly. °10. Wash your hands well. °Cleaning the Drainage Bag °1. Wash  your hands with soap and water. °2. Wash the bag in warm, soapy water. °3. Rinse the bag thoroughly with warm water. °4. Fill the bag with a solution of white vinegar and water (1 cup vinegar to 1 qt warm water [.2 L vinegar to 1 L warm water]). Close the bag and soak it for 30 minutes in the solution. °5. Rinse the bag with warm water. °6. Hang the bag to dry with the pour spout open and hanging downward. °7. Store the clean bag (once it is dry) in a clean plastic bag. °8. Wash your hands well. °PREVENTING INFECTION °· Wash your hands before and after handling your catheter. °· Take showers daily and wash the area where the catheter enters your body. Do not take baths. Replace wet leg straps with dry ones, if this applies. °· Do   not use powders, sprays, or lotions on the genital area. Only use creams, lotions, or ointments as directed by your caregiver. °· For females, wipe from front to back after each bowel movement. °· Drink enough fluids to keep your urine clear or pale yellow unless you have a fluid restriction. °· Do not let the drainage bag or tubing touch or lie on the floor. °· Wear cotton underwear to absorb moisture and to keep your skin drier. °SEEK MEDICAL CARE IF:  °· Your urine is cloudy or smells unusually bad. °· Your catheter becomes clogged. °· You are not draining urine into the bag or your bladder feels full. °· Your catheter starts to leak. °SEEK IMMEDIATE MEDICAL CARE IF:  °· You have pain, swelling, redness, or pus where the catheter enters the body. °· You have pain in the abdomen, legs, lower back, or bladder. °· You have a fever. °· You see blood fill the catheter, or your urine is pink or red. °· You have nausea, vomiting, or chills. °· Your catheter gets pulled out. °MAKE SURE YOU:  °· Understand these instructions. °· Will watch your condition. °· Will get help right away if you are not doing well or get worse. °  °This information is not intended to replace advice given to you by your  health care provider. Make sure you discuss any questions you have with your health care provider. °  °Document Released: 10/21/2005 Document Revised: 03/07/2014 Document Reviewed: 10/12/2012 °Elsevier Interactive Patient Education ©2016 [employer] ° °

## 2018-01-20 ENCOUNTER — Telehealth: Payer: Self-pay | Admitting: Family Medicine

## 2018-01-20 ENCOUNTER — Encounter: Payer: Self-pay | Admitting: Family Medicine

## 2018-01-20 ENCOUNTER — Ambulatory Visit (INDEPENDENT_AMBULATORY_CARE_PROVIDER_SITE_OTHER): Payer: 59 | Admitting: Family Medicine

## 2018-01-20 VITALS — BP 122/80 | HR 98 | Temp 98.9°F | Ht 64.0 in | Wt 168.1 lb

## 2018-01-20 DIAGNOSIS — J181 Lobar pneumonia, unspecified organism: Secondary | ICD-10-CM

## 2018-01-20 DIAGNOSIS — L719 Rosacea, unspecified: Secondary | ICD-10-CM | POA: Diagnosis not present

## 2018-01-20 DIAGNOSIS — H6123 Impacted cerumen, bilateral: Secondary | ICD-10-CM

## 2018-01-20 DIAGNOSIS — J189 Pneumonia, unspecified organism: Secondary | ICD-10-CM | POA: Insufficient documentation

## 2018-01-20 MED ORDER — RA EAR WAX CLEANSING SYSTEM 6.5 % OT KIT: PACK | OTIC | 1 refills | Status: AC

## 2018-01-20 MED ORDER — DOXYCYCLINE HYCLATE 100 MG PO CAPS: 100.0000 mg | ORAL_CAPSULE | Freq: Two times a day (BID) | ORAL | 0 refills | Status: AC

## 2018-01-20 MED ORDER — DM-GUAIFENESIN ER 30-600 MG PO TB12: 1.0000 | ORAL_TABLET | Freq: Two times a day (BID) | ORAL | 0 refills | Status: AC | PRN

## 2018-01-20 NOTE — Telephone Encounter (Signed)
Copied from CRM [phone]. Topic: Quick Communication - Rx Refill/Question >> Jan 20, 2018 12:08 PM Crist InfanteHarrald, Kathy J wrote: [facility] Leave as is.  [facility]

## 2018-01-20 NOTE — Patient Instructions (Addendum)
Community-Acquired Pneumonia, Adult Pneumonia is an infection of the lungs. There are different types of pneumonia. One type can develop while a person is in a hospital. A different type, called community-acquired pneumonia, develops in people who are not, or have not recently been, in the hospital or other health care facility. What are the causes? Pneumonia may be caused by bacteria, viruses, or funguses. Community-acquired pneumonia is often caused by Streptococcus pneumonia bacteria. These bacteria are often passed from one person to another by breathing in droplets from the cough or sneeze of an infected person. What increases the risk? The condition is more likely to develop in:  People who havechronic diseases, such as chronic obstructive pulmonary disease (COPD), asthma, congestive heart failure, cystic fibrosis, diabetes, or kidney disease.  People who haveearly-stage or late-stage HIV.  People who havesickle cell disease.  People who havehad their spleen removed (splenectomy).  People who havepoor dental hygiene.  People who havemedical conditions that increase the risk of breathing in (aspirating) secretions their own mouth and nose.  People who havea weakened immune system (immunocompromised).  People who smoke.  People whotravel to areas where pneumonia-causing germs commonly exist.  People whoare around animal habitats or animals that have pneumonia-causing germs, including birds, bats, rabbits, cats, and farm animals.  What are the signs or symptoms? Symptoms of this condition include:  Adry cough.  A wet (productive) cough.  Fever.  Sweating.  Chest pain, especially when breathing deeply or coughing.  Rapid breathing or difficulty breathing.  Shortness of breath.  Shaking chills.  Fatigue.  Muscle aches.  How is this diagnosed? Your health care provider will take a medical history and perform a physical exam. You may also have other tests,  including:  Imaging studies of your chest, including X-rays.  Tests to check your blood oxygen level and other blood gases.  Other tests on blood, mucus (sputum), fluid around your lungs (pleural fluid), and urine.  If your pneumonia is severe, other tests may be done to identify the specific cause of your illness. How is this treated? The type of treatment that you receive depends on many factors, such as the cause of your pneumonia, the medicines you take, and other medical conditions that you have. For most adults, treatment and recovery from pneumonia may occur at home. In some cases, treatment must happen in a hospital. Treatment may include:  Antibiotic medicines, if the pneumonia was caused by bacteria.  Antiviral medicines, if the pneumonia was caused by a virus.  Medicines that are given by mouth or through an IV tube.  Oxygen.  Respiratory therapy.  Although rare, treating severe pneumonia may include:  Mechanical ventilation. This is done if you are not breathing well on your own and you cannot maintain a safe blood oxygen level.  Thoracentesis. This procedureremoves fluid around one lung or both lungs to help you breathe better.  Follow these instructions at home:  Take over-the-counter and prescription medicines only as told by your health care provider. ? Only takecough medicine if you are losing sleep. Understand that cough medicine can prevent your body's natural ability to remove mucus from your lungs. ? If you were prescribed an antibiotic medicine, take it as told by your health care provider. Do not stop taking the antibiotic even if you start to feel better.  Sleep in a semi-upright position at night. Try sleeping in a reclining chair, or place a few pillows under your head.  Do not use tobacco products, including cigarettes, chewing   tobacco, and e-cigarettes. If you need help quitting, ask your health care provider.  Drink enough water to keep your urine  clear or pale yellow. This will help to thin out mucus secretions in your lungs. How is this prevented? There are ways that you can decrease your risk of developing community-acquired pneumonia. Consider getting a pneumococcal vaccine if:  You are older than 50 years of age.  You are older than 50 years of age and are undergoing cancer treatment, have chronic lung disease, or have other medical conditions that affect your immune system. Ask your health care provider if this applies to you.  There are different types and schedules of pneumococcal vaccines. Ask your health care provider which vaccination option is best for you. You may also prevent community-acquired pneumonia if you take these actions:  Get an influenza vaccine every year. Ask your health care provider which type of influenza vaccine is best for you.  Go to the dentist on a regular basis.  Wash your hands often. Use hand sanitizer if soap and water are not available.  Contact a health care provider if:  You have a fever.  You are losing sleep because you cannot control your cough with cough medicine. Get help right away if:  You have worsening shortness of breath.  You have increased chest pain.  Your sickness becomes worse, especially if you are an older adult or have a weakened immune system.  You cough up blood. This information is not intended to replace advice given to you by your health care provider. Make sure you discuss any questions you have with your health care provider. Document Released: 10/21/2005 Document Revised: 02/29/2016 Document Reviewed: 02/15/2015 Elsevier Interactive Patient Education  2018 [employer]  Earwax Buildup, Adult The ears produce a substance called earwax that helps keep bacteria out of the ear and protects the skin in the ear canal. Occasionally, earwax can build up in the ear and cause discomfort or hearing loss. What increases the risk? This condition is more likely to  develop in people who:  Are female.  Are elderly.  Naturally produce more earwax.  Clean their ears often with cotton swabs.  Use earplugs often.  Use in-ear headphones often.  Wear hearing aids.  Have narrow ear canals.  Have earwax that is overly thick or sticky.  Have eczema.  Are dehydrated.  Have excess hair in the ear canal.  What are the signs or symptoms? Symptoms of this condition include:  Reduced or muffled hearing.  A feeling of fullness in the ear or feeling that the ear is plugged.  Fluid coming from the ear.  Ear pain.  Ear itch.  Ringing in the ear.  Coughing.  An obvious piece of earwax that can be seen inside the ear canal.  How is this diagnosed? This condition may be diagnosed based on:  Your symptoms.  Your medical history.  An ear exam. During the exam, your health care provider will look into your ear with an instrument called an otoscope.  You may have tests, including a hearing test. How is this treated? This condition may be treated by:  Using ear drops to soften the earwax.  Having the earwax removed by a health care provider. The health care provider may: ? Flush the ear with water. ? Use an instrument that has a loop on the end (curette). ? Use a suction device.  Surgery to remove the wax buildup. This may be done in severe cases.  Follow these instructions at home:  Take over-the-counter and prescription medicines only as told by your health care provider.  Do not put any objects, including cotton swabs, into your ear. You can clean the opening of your ear canal with a washcloth or facial tissue.  Follow instructions from your health care provider about cleaning your ears. Do not over-clean your ears.  Drink enough fluid to keep your urine clear or pale yellow. This will help to thin the earwax.  Keep all follow-up visits as told by your health care provider. If earwax builds up in your ears often or if you use  hearing aids, consider seeing your health care provider for routine, preventive ear cleanings. Ask your health care provider how often you should schedule your cleanings.  If you have hearing aids, clean them according to instructions from the manufacturer and your health care provider. Contact a health care provider if:  You have ear pain.  You develop a fever.  You have blood, pus, or other fluid coming from your ear.  You have hearing loss.  You have ringing in your ears that does not go away.  Your symptoms do not improve with treatment.  You feel like the room is spinning (vertigo). Summary  Earwax can build up in the ear and cause discomfort or hearing loss.  The most common symptoms of this condition include reduced or muffled hearing and a feeling of fullness in the ear or feeling that the ear is plugged.  This condition may be diagnosed based on your symptoms, your medical history, and an ear exam.  This condition may be treated by using ear drops to soften the earwax or by having the earwax removed by a health care provider.  Do not put any objects, including cotton swabs, into your ear. You can clean the opening of your ear canal with a washcloth or facial tissue. This information is not intended to replace advice given to you by your health care provider. Make sure you discuss any questions you have with your health care provider. Document Released: 11/28/2004 Document Revised: 01/01/2017 Document Reviewed: 01/01/2017 Elsevier Interactive Patient Education  2018 [employer].  Rosacea Rosacea is a long-term (chronic) condition that affects the skin of the face, including the cheeks, nose, brow, and chin. This condition can also affect the eyes. Rosacea causes blood vessels near the surface of the skin to enlarge, which results in redness. What are the causes? The cause of this condition is not known. Certain triggers can make rosacea worse, including:  Hot  baths.  Exercise.  Sunlight.  Very hot or cold temperatures.  Hot or spicy foods and drinks.  Drinking alcohol.  Stress.  Taking blood pressure medicine.  Long-term use of topical steroids on the face.  What increases the risk? This condition is more likely to develop in:  People who are older than 50 years of age.  Women.  People who have light-colored skin (light complexion).  People who have a family history of rosacea.  What are the signs or symptoms? Symptoms of this condition include:  Redness of the face.  Red bumps or pimples on the face.  A red, enlarged nose.  Blushing easily.  Red lines on the skin.  Irritated or burning feeling in the eyes.  Swollen eyelids.  How is this diagnosed? This condition is diagnosed with a medical history and physical exam. How is this treated? There is no cure for this condition, but treatment can help to control  your symptoms. Your health care provider may recommend that you see a skin specialist (dermatologist). Treatment may include:  Antibiotic medicines that are applied to the skin or taken as a pill.  Laser treatment to improve the appearance of the skin.  Surgery. This is rare.  Your health care provider will also recommend the best way to take care of your skin. Even after your skin improves, you will likely need to continue treatment to prevent your rosacea from coming back. Follow these instructions at home: Skin Care Take care of your skin as told by your health care provider. You may be told to do these things:  Wash your skin gently two or more times each day.  Use mild soap.  Use a sunscreen or sunblock with SPF 30 or greater.  Use gentle cosmetics that are meant for sensitive skin.  Shave with an electric shaver instead of a blade.  Lifestyle  Try to keep track of what foods trigger this condition. Avoid any triggers. These may include: ? Spicy foods. ? Seafood. ? Cheese. ? Hot  liquids. ? Nuts. ? Chocolate. ? Iodized salt.  Do not drink alcohol.  Avoid extremely cold or hot temperatures.  Try to reduce your stress. If you need help, talk with your health care provider.  When you exercise, do these things to stay cool: ? Limit your sun exposure. ? Use a fan. ? Do shorter and more frequent intervals of exercise. General instructions  Keep all follow-up visits as told by your health care provider. This is important.  Take over-the-counter and prescription medicines only as told by your health care provider.  If your eyelids are affected, apply warm compresses to them. Do this as told by your health care provider.  If you were prescribed an antibiotic medicine, apply or take it as told by your health care provider. Do not stop using the antibiotic even if your condition improves. Contact a health care provider if:  Your symptoms get worse.  Your symptoms do not improve after two months of treatment.  You have new symptoms.  You have any changes in vision or you have problems with your eyes, such as redness or itching.  You feel depressed.  You lose your appetite.  You have trouble concentrating. This information is not intended to replace advice given to you by your health care provider. Make sure you discuss any questions you have with your health care provider. Document Released: 11/28/2004 Document Revised: 03/28/2016 Document Reviewed: 12/28/2014 Elsevier Interactive Patient Education  [employer].

## 2018-01-20 NOTE — Telephone Encounter (Signed)
Copied from CRM [id number]. Topic: Quick Communication - Rx Refill/Question >> Jan 20, 2018 10:41 AM Arlyss Gandyichardson, Karen Cisneros, NT wrote: Medication: all medications that were sent in today   Has the patient contacted their pharmacy? Yes.     (Agent: If no, request that the patient contact the pharmacy for the refill.)   Preferred [facility] (with phone number [facility] [place]. She [facility]   Agent: Please be advised that RX refills may take up to 3 business days. We ask that you follow-up with your pharmacy.

## 2018-01-20 NOTE — Progress Notes (Signed)
Subjective:  Patient ID: Karen Cisneros, female    DOB: November 24, 1965  Age: 52 y.o. MRN: [id number]  CC: Establish Care   HPI Karen Cisneros presents for evaluation tract illness.  Patient believes that this started like illness with high fever cough body aches..  She is now left with a persistent cough with scant phlegm production with some tightness in the chest.  She is no longer febrile is coughing scant phlegm.  She does not smoke and has no history of asthma.  She has a history of rosacea and believes that this illness may have flared her rosacea.  She cares for her small grandchildren who have been diagnosed with the flu recently.  She does not take a flu shot.  Her husband takes a flu shot is been well.  Patient tells of the ear congestion that is chronic.  She also tells of chronic anxiety and is currently not treated.  [facility].  She was prescribed Levaquin and could not afford the $190 prescription cost.  No outpatient medications prior to visit.   No facility-administered medications prior to visit.     ROS Review of Systems  Constitutional: Positive for fatigue. Negative for chills and fever.  HENT: Positive for postnasal drip.   Eyes: Negative for photophobia and visual disturbance.  Respiratory: Positive for cough and chest tightness. Negative for wheezing.   Cardiovascular: Negative for chest pain, palpitations and leg swelling.  Gastrointestinal: Negative.   Genitourinary: Negative.   Musculoskeletal: Negative for arthralgias and myalgias.  Skin: Positive for color change and rash.  Allergic/Immunologic: Negative for immunocompromised state.  Neurological: Negative for headaches.  Hematological: Does not bruise/bleed easily.  Psychiatric/Behavioral: The patient is nervous/anxious.     Objective:  BP 122/80 (BP Location: Left Arm, Patient Position: Sitting, Cuff Size: Normal)   Pulse 98   Temp 98.9 F (37.2 C)  (Oral)   Ht 5' 4"  (1.626 m)   Wt 168 lb 2 oz (76.3 kg)   SpO2 94%   BMI 28.86 kg/m   BP Readings from Last 3 Encounters:  01/20/18 122/80  12/22/15 124/76  04/01/12 124/79    Wt Readings from Last 3 Encounters:  01/20/18 168 lb 2 oz (76.3 kg)  12/22/15 150 lb (68 kg)  04/01/12 159 lb (72.1 kg)    Physical Exam  Constitutional: She is oriented to person, place, and time. She appears well-developed and well-nourished. No distress.  HENT:  Head: Normocephalic and atraumatic.  Right Ear: External ear normal.  Left Ear: External ear normal.  Mouth/Throat: Oropharynx is clear and moist. No oropharyngeal exudate.  Eyes: Pupils are equal, round, and reactive to light. Right eye exhibits no discharge. Left eye exhibits no discharge. No scleral icterus.  Neck: Neck supple. No JVD present. No tracheal deviation present. No thyromegaly present.  Cardiovascular: Normal rate, regular rhythm and normal heart sounds.  Pulmonary/Chest: Effort normal. No accessory muscle usage or stridor. No respiratory distress. She has no decreased breath sounds. She has no wheezes. She has no rhonchi. She has rales in the left middle field and the left lower field.  Abdominal: Bowel sounds are normal.  Lymphadenopathy:    She has no cervical adenopathy.  Neurological: She is alert and oriented to person, place, and time.  Skin: Skin is warm and dry. Rash noted. Rash is maculopapular. She is not diaphoretic.     Psychiatric: She has a normal mood and affect. Her behavior is  normal.    Lab Results  Component Value Date   WBC 10.0 12/22/2015   HGB 12.5 12/22/2015   HCT 38.8 12/22/2015   PLT 266 12/22/2015   GLUCOSE 136 (H) 12/22/2015   CHOL 195 01/21/2011   TRIG 100.0 01/21/2011   HDL 45.00 01/21/2011   LDLCALC 130 (H) 01/21/2011   ALT 8 01/21/2011   AST 17 01/21/2011   NA 137 12/22/2015   K 4.1 12/22/2015   CL 104 12/22/2015   CREATININE 0.84 12/22/2015   BUN 13 12/22/2015   CO2 24 12/22/2015    TSH 2.07 12/12/2010    No results found.  Assessment & Plan:   Karen Cisneros was seen today for establish care.  Diagnoses and all orders for this visit:  Pneumonia of left lower lobe due to infectious organism ([place]) -     doxycycline (VIBRAMYCIN) 100 MG capsule; Take 1 capsule (100 mg total) by mouth 2 (two) times daily for 10 days. -     dextromethorphan-guaiFENesin (MUCINEX DM) 30-600 MG 12hr tablet; Take 1 tablet by mouth 2 (two) times daily as needed for cough.  Ceruminosis, bilateral -     Carbamide Peroxide-Saline (RA EAR WAX CLEANSING SYSTEM) 6.5 % KIT; Follow kit instructions  Rosacea -     doxycycline (VIBRAMYCIN) 100 MG capsule; Take 1 capsule (100 mg total) by mouth 2 (two) times daily for 10 days.   I am having Karen Cisneros start on doxycycline, RA EAR WAX CLEANSING SYSTEM, and dextromethorphan-guaiFENesin.  Meds ordered this encounter  Medications  . doxycycline (VIBRAMYCIN) 100 MG capsule    Sig: Take 1 capsule (100 mg total) by mouth 2 (two) times daily for 10 days.    Dispense:  20 capsule    Refill:  0  . Carbamide Peroxide-Saline (RA EAR WAX CLEANSING SYSTEM) 6.5 % KIT    Sig: Follow kit instructions    Dispense:  1 kit    Refill:  1  . dextromethorphan-guaiFENesin (MUCINEX DM) 30-600 MG 12hr tablet    Sig: Take 1 tablet by mouth 2 (two) times daily as needed for cough.    Dispense:  20 tablet    Refill:  0     Follow-up: Return in about 1 week (around 01/27/2018), or if symptoms worsen or fail to improve.  Will send in Diflucan if needed.  Had a long discussion with patient about possibly using Augmentin for this.  Am also trying to treat her rosacea flare.  Discussed her follow-up need for both pneumonia vaccines.  Also advised her to except the flu vaccine in the future.  She will follow-up in 1 week or sooner if she does not respond to the doxycycline and Mucinex DM  Libby Maw, MD

## 2019-02-23 ENCOUNTER — Telehealth: Payer: Self-pay | Admitting: Family Medicine

## 2019-02-23 NOTE — Telephone Encounter (Signed)
Called and left vm for patient. Calling to schedule virtual visit with [facility].  °

## 2020-01-13 ENCOUNTER — Ambulatory Visit: Payer: 59 | Attending: Internal Medicine

## 2020-01-13 DIAGNOSIS — Z23 Encounter for immunization: Secondary | ICD-10-CM

## 2020-01-13 NOTE — Progress Notes (Signed)
   Covid-19 Vaccination Clinic  Name:  Karen Cisneros    MRN: [id number] DOB: 1966/08/28  01/13/2020  Karen Cisneros was observed post Covid-19 immunization for 15 minutes without incident. She was provided with Vaccine Information Sheet and instruction to access the V-Safe system.   Karen Cisneros was instructed to call 911 with any severe reactions post vaccine: Marland Kitchen Difficulty breathing  . Swelling of face and throat  . A fast heartbeat  . A bad rash all over body  . Dizziness and weakness   Immunizations Administered    Name Date Dose VIS Date Route   Pfizer COVID-19 Vaccine 01/13/2020  4:08 PM 0.3 mL 10/15/2019 Intramuscular   Manufacturer: [employer], [employer]   Lot: [id number]   NDC: [address]-6

## 2020-02-07 ENCOUNTER — Ambulatory Visit: Payer: 59 | Attending: Internal Medicine

## 2020-02-07 DIAGNOSIS — Z23 Encounter for immunization: Secondary | ICD-10-CM

## 2020-02-07 NOTE — Progress Notes (Signed)
   Covid-19 Vaccination Clinic  Name:  Miarose Lippert    MRN: [id number] DOB: 1966-08-31  02/07/2020  Ms. Tecson was observed post Covid-19 immunization for 15 minutes without incident. She was provided with Vaccine Information Sheet and instruction to access the V-Safe system.   Ms. Winters was instructed to call 911 with any severe reactions post vaccine: Marland Kitchen Difficulty breathing  . Swelling of face and throat  . A fast heartbeat  . A bad rash all over body  . Dizziness and weakness   Immunizations Administered    Name Date Dose VIS Date Route   Pfizer COVID-19 Vaccine 02/07/2020  4:21 PM 0.3 mL 10/15/2019 Intramuscular   Manufacturer: [employer], [employer]   Lot: [id number]   NDC: [address]-3
